# Patient Record
Sex: Female | Born: 1965 | Race: White | Hispanic: No | Marital: Married | Smoking: Never smoker
Health system: Southern US, Community
[De-identification: ages and names within clinical notes are randomized; demographics above are authoritative.]

## PROBLEM LIST (undated history)

## (undated) DIAGNOSIS — K219 Gastro-esophageal reflux disease without esophagitis: Secondary | ICD-10-CM

## (undated) DIAGNOSIS — M40209 Unspecified kyphosis, site unspecified: Secondary | ICD-10-CM

## (undated) DIAGNOSIS — T7840XA Allergy, unspecified, initial encounter: Secondary | ICD-10-CM

## (undated) HISTORY — PX: COLONOSCOPY: SHX174

## (undated) HISTORY — DX: Unspecified kyphosis, site unspecified: M40.209

## (undated) HISTORY — PX: WRIST SURGERY: SHX841

## (undated) HISTORY — PX: UPPER GASTROINTESTINAL ENDOSCOPY: SHX188

## (undated) HISTORY — DX: Allergy, unspecified, initial encounter: T78.40XA

## (undated) HISTORY — PX: HIP FRACTURE SURGERY: SHX118

## (undated) HISTORY — DX: Gastro-esophageal reflux disease without esophagitis: K21.9

---

## 1997-10-04 ENCOUNTER — Other Ambulatory Visit: Admission: RE | Admit: 1997-10-04 | Discharge: 1997-10-04 | Payer: Self-pay | Admitting: Obstetrics and Gynecology

## 1998-09-19 ENCOUNTER — Encounter: Payer: Self-pay | Admitting: Gastroenterology

## 1998-09-19 ENCOUNTER — Encounter (INDEPENDENT_AMBULATORY_CARE_PROVIDER_SITE_OTHER): Payer: Self-pay | Admitting: Specialist

## 1998-09-19 ENCOUNTER — Other Ambulatory Visit: Admission: RE | Admit: 1998-09-19 | Discharge: 1998-09-19 | Payer: Self-pay | Admitting: Gastroenterology

## 1998-10-07 ENCOUNTER — Other Ambulatory Visit: Admission: RE | Admit: 1998-10-07 | Discharge: 1998-10-07 | Payer: Self-pay | Admitting: Obstetrics and Gynecology

## 1999-11-12 ENCOUNTER — Other Ambulatory Visit: Admission: RE | Admit: 1999-11-12 | Discharge: 1999-11-12 | Payer: Self-pay | Admitting: *Deleted

## 1999-12-04 ENCOUNTER — Encounter: Admission: RE | Admit: 1999-12-04 | Discharge: 1999-12-04 | Payer: Self-pay | Admitting: Obstetrics and Gynecology

## 1999-12-04 ENCOUNTER — Encounter: Payer: Self-pay | Admitting: Obstetrics and Gynecology

## 2000-11-15 ENCOUNTER — Other Ambulatory Visit: Admission: RE | Admit: 2000-11-15 | Discharge: 2000-11-15 | Payer: Self-pay | Admitting: Obstetrics and Gynecology

## 2001-12-21 ENCOUNTER — Other Ambulatory Visit: Admission: RE | Admit: 2001-12-21 | Discharge: 2001-12-21 | Payer: Self-pay | Admitting: *Deleted

## 2002-12-27 ENCOUNTER — Other Ambulatory Visit: Admission: RE | Admit: 2002-12-27 | Discharge: 2002-12-27 | Payer: Self-pay

## 2003-01-03 ENCOUNTER — Encounter: Admission: RE | Admit: 2003-01-03 | Discharge: 2003-01-03 | Payer: Self-pay | Admitting: Obstetrics and Gynecology

## 2004-11-18 ENCOUNTER — Ambulatory Visit: Payer: Self-pay | Admitting: Gastroenterology

## 2005-12-02 ENCOUNTER — Ambulatory Visit: Payer: Self-pay | Admitting: Gastroenterology

## 2007-08-18 ENCOUNTER — Telehealth: Payer: Self-pay | Admitting: Gastroenterology

## 2007-10-05 ENCOUNTER — Ambulatory Visit: Payer: Self-pay | Admitting: Gastroenterology

## 2007-10-05 DIAGNOSIS — K219 Gastro-esophageal reflux disease without esophagitis: Secondary | ICD-10-CM

## 2007-10-26 ENCOUNTER — Telehealth: Payer: Self-pay | Admitting: Gastroenterology

## 2010-07-04 NOTE — Assessment & Plan Note (Signed)
[facility]                           GASTROENTEROLOGY OFFICE NOTE   BAY, JARQUIN                         MRN:          [id number]  DATE:12/02/2005                            DOB:          1965-04-06    This is a return office visit for GERD. Her reflux symptoms are well  controlled on AcipHex 40 mg p.o. q.a.m.  She has no dysphagia, odynophagia,  nocturnal symptoms, abdominal pain, change in bowel habits, melena,  hematochezia or weight loss.   CURRENT MEDICATIONS:  As listed on the chart; updated and reviewed.   ALLERGIES:  PENICILLIN AND SULFA DRUGS.   PHYSICAL EXAMINATION:  In no acute distress. Weight: 110 pounds. Blood  pressure: 108/70. Pulse: 80 and regular.  HEENT: Anicteric sclera. Oropharynx clear.  CHEST: Clear to auscultation bilaterally.  CARDIAC: Regular rate and rhythm without murmurs appreciated.  ABDOMEN: Soft and nontender. Nondistended. Normoactive bowel sounds. No  palpable organomegaly, masses or hernias.   ASSESSMENT/PLAN:  Gastroesophageal reflux disease.  Continue antireflux  measures and AcipHex 40 mg p.o. q.a.m. Return office visit one year.       Venita Lick. Russella Dar, MD, [facility]      MTS/MedQ  DD:  12/02/2005  DT:  12/03/2005  Job #:  [id number]

## 2011-04-07 ENCOUNTER — Ambulatory Visit: Payer: Self-pay | Admitting: Physician Assistant

## 2011-04-07 VITALS — BP 139/90 | HR 85 | Temp 98.1°F | Resp 16 | Ht 62.25 in | Wt 109.8 lb

## 2011-04-07 DIAGNOSIS — J019 Acute sinusitis, unspecified: Secondary | ICD-10-CM

## 2011-04-07 MED ORDER — IPRATROPIUM BROMIDE 0.03 % NA SOLN: 2.0000 | Freq: Two times a day (BID) | NASAL | Status: DC

## 2011-04-07 MED ORDER — CEFDINIR 300 MG PO CAPS: 600.0000 mg | ORAL_CAPSULE | Freq: Every day | ORAL | Status: AC

## 2011-04-07 NOTE — Progress Notes (Signed)
  Subjective:    Patient ID: SENG FOUTS, female    DOB: Mar 11, 1965, 46 y.o.   MRN: [id number]  Sore Throat  Associated symptoms include congestion, coughing and swollen glands. Pertinent negatives include no abdominal pain, diarrhea, ear pain, headaches, plugged ear sensation, neck pain or vomiting.  URI  The current episode started 1 to 4 weeks ago. Associated symptoms include congestion, coughing, rhinorrhea, a sore throat, swollen glands and wheezing. Pertinent negatives include no abdominal pain, chest pain, diarrhea, dysuria, ear pain, headaches, joint pain, joint swelling, nausea, neck pain, plugged ear sensation, rash, sinus pain, sneezing or vomiting. Treatments tried: took cephalexin 500 mg TID x 1 week. The treatment provided mild (temporary improvement) relief.   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 She completed a course of cephalexin for a dental infection around Christmas.  She is scheduled to have a root canal, but had to re-schedule due to this new illness.  She took a second course of cephalexin for her current illness with some improvement, but without complete relief.   Review of Systems  HENT: Positive for congestion, sore throat and rhinorrhea. Negative for ear pain, sneezing and neck pain.   Respiratory: Positive for cough and wheezing.   Cardiovascular: Negative for chest pain.  Gastrointestinal: Negative for nausea, vomiting, abdominal pain and diarrhea.  Genitourinary: Negative for dysuria.  Musculoskeletal: Negative for joint pain.  Skin: Negative for rash.  Neurological: Negative for headaches.       Objective:   Physical Exam  Vitals reviewed. Constitutional: She is oriented to person, place, and time. Vital signs are normal. She appears well-developed and well-nourished. No distress.  HENT:  Head: Normocephalic and atraumatic.  Right Ear: Hearing, tympanic membrane, external ear and ear canal normal.  Left Ear: Hearing, tympanic membrane, external  ear and ear canal normal.  Nose: Mucosal edema and rhinorrhea present.  No foreign bodies. Right sinus exhibits no maxillary sinus tenderness and no frontal sinus tenderness. Left sinus exhibits no maxillary sinus tenderness and no frontal sinus tenderness.  Mouth/Throat: Uvula is midline, oropharynx is clear and moist and mucous membranes are normal. No uvula swelling. No oropharyngeal exudate.  Eyes: Conjunctivae and EOM are normal. Pupils are equal, round, and reactive to light. Right eye exhibits no discharge. Left eye exhibits no discharge. No scleral icterus.  Neck: Trachea normal, normal range of motion and full passive range of motion without pain. Neck supple. No mass and no thyromegaly present.  Cardiovascular: Normal rate, regular rhythm and normal heart sounds.   Pulmonary/Chest: Effort normal and breath sounds normal.  Lymphadenopathy:       Head (right side): No submandibular, no tonsillar, no preauricular, no posterior auricular and no occipital adenopathy present.       Head (left side): No submandibular, no tonsillar, no preauricular and no occipital adenopathy present.    She has no cervical adenopathy.       Right: No supraclavicular adenopathy present.       Left: No supraclavicular adenopathy present.  Neurological: She is alert and oriented to person, place, and time. She has normal strength. No cranial nerve deficit or sensory deficit.  Skin: Skin is warm, dry and intact. No rash noted.  Psychiatric: She has a normal mood and affect. Her speech is normal and behavior is normal.       Assessment & Plan:  Sinusitis.  Cefdinir 300 mg, 2 PO BID x 10 days.  Atrovent NS BID.  Mucinex DM.  Rest, fluids, NSAIDS.

## 2011-04-07 NOTE — Patient Instructions (Signed)
Drink lots of water and get lots of rest.  Use atrovent nasal spray and mucinex.

## 2014-06-22 ENCOUNTER — Encounter: Payer: Self-pay | Admitting: Gastroenterology

## 2015-04-10 ENCOUNTER — Ambulatory Visit (INDEPENDENT_AMBULATORY_CARE_PROVIDER_SITE_OTHER): Payer: Self-pay

## 2015-04-10 ENCOUNTER — Ambulatory Visit (INDEPENDENT_AMBULATORY_CARE_PROVIDER_SITE_OTHER): Payer: Self-pay | Admitting: Emergency Medicine

## 2015-04-10 VITALS — BP 110/70 | HR 96 | Temp 100.3°F | Resp 17 | Ht 62.0 in | Wt 116.0 lb

## 2015-04-10 DIAGNOSIS — R059 Cough, unspecified: Secondary | ICD-10-CM

## 2015-04-10 DIAGNOSIS — R509 Fever, unspecified: Secondary | ICD-10-CM

## 2015-04-10 DIAGNOSIS — R05 Cough: Secondary | ICD-10-CM

## 2015-04-10 DIAGNOSIS — J189 Pneumonia, unspecified organism: Secondary | ICD-10-CM

## 2015-04-10 LAB — CBC WITH DIFFERENTIAL/PLATELET
Basophils Absolute: 0 10*3/uL (ref 0.0–0.1)
Basophils Relative: 0 % (ref 0–1)
Eosinophils Absolute: 0 10*3/uL (ref 0.0–0.7)
Eosinophils Relative: 1 % (ref 0–5)
HEMATOCRIT: 42.2 % (ref 36.0–46.0)
HEMOGLOBIN: 14 g/dL (ref 12.0–15.0)
LYMPHS PCT: 21 % (ref 12–46)
Lymphs Abs: 1 10*3/uL (ref 0.7–4.0)
MCH: 30.2 pg (ref 26.0–34.0)
MCHC: 33.2 g/dL (ref 30.0–36.0)
MCV: 91.1 fL (ref 78.0–100.0)
MONO ABS: 0.3 10*3/uL (ref 0.1–1.0)
MPV: 9.6 fL (ref 8.6–12.4)
Monocytes Relative: 7 % (ref 3–12)
NEUTROS ABS: 3.3 10*3/uL (ref 1.7–7.7)
Neutrophils Relative %: 71 % (ref 43–77)
Platelets: 193 10*3/uL (ref 150–400)
RBC: 4.63 MIL/uL (ref 3.87–5.11)
RDW: 13.5 % (ref 11.5–15.5)
WBC: 4.6 10*3/uL (ref 4.0–10.5)

## 2015-04-10 LAB — POCT INFLUENZA A/B
INFLUENZA B, POC: NEGATIVE
Influenza A, POC: NEGATIVE

## 2015-04-10 MED ORDER — LEVOFLOXACIN 500 MG PO TABS
500.0000 mg | ORAL_TABLET | Freq: Every day | ORAL | Status: AC
Start: 2015-04-10 — End: 2015-04-20

## 2015-04-10 MED ORDER — HYDROCODONE-HOMATROPINE 5-1.5 MG/5ML PO SYRP: 5.0000 mL | ORAL_SOLUTION | Freq: Three times a day (TID) | ORAL | Status: DC | PRN

## 2015-04-10 NOTE — Progress Notes (Signed)
By signing my name below, I, Moises Blood, attest that this documentation has been prepared under the direction and in the presence of Arlyss Queen, MD. Electronically Signed: Moises Blood, [place]. 04/10/2015 , 11:46 AM .  Patient was seen in room 2 .  Chief Complaint:  Chief Complaint  Patient presents with  . URI  . Nasal Congestion  . Cough    HPI: Morgan Merritt is a 50 y.o. female who reports to [facility] today complaining of cough and nasal congestion that started 5 days ago. Pt started coughing and feeling ill. She had some leftover zpak and started taking that 4 days ago. This usually works in the past but hasn't had any relief this time. She also notes having rhinorrhea and would cough up yellow phlegm. She's also tried mucinex last night. She worked a 12 hour shift yesterday. She was feeling warm last night. She denies receiving flu shot this year. She denies smoking.   She works as a Software engineer at [employer].   No past medical history on file. No past surgical history on file. Social History   Social History  . Marital Status: Married    Spouse Name: N/A  . Number of Children: N/A  . Years of Education: N/A   Social History Main Topics  . Smoking status: Never Smoker   . Smokeless tobacco: Not on file  . Alcohol Use: Not on file  . Drug Use: Not on file  . Sexual Activity: Not on file   Other Topics Concern  . Not on file   Social History Narrative  . No narrative on file   No family history on file. Allergies  Allergen Reactions  . Sulfonamide Derivatives Hives  . Penicillins Rash   Prior to Admission medications   Medication Sig Start Date End Date Taking? Authorizing Provider  Cranberry (AZO-CRANBERRY) 450 MG TABS Take by mouth.   Yes Historical Provider, MD  lansoprazole (PREVACID) 15 MG capsule Take 15 mg by mouth daily.   Yes Historical Provider, MD  Tragacanth (ASTRAGALUS ROOT) POWD by Does not apply route.   Yes Historical Provider, MD      ROS:  Constitutional: negative for night sweats, weight changes; positive for fever, chills, fatigue,  HEENT: negative for vision changes, hearing loss, ST, epistaxis, or sinus pressure; positive for congestion, rhinorrhea Cardiovascular: negative for chest pain or palpitations Respiratory: negative for hemoptysis, wheezing, shortness of breath; positive for cough Abdominal: negative for abdominal pain, nausea, vomiting, diarrhea, or constipation Dermatological: negative for rash Neurologic: negative for headache, dizziness, or syncope All other systems reviewed and are otherwise negative with the exception to those above and in the HPI.  PHYSICAL EXAM: Filed Vitals:   04/10/15 1055  BP: 110/70  Pulse: 96  Temp: 100.3 F (37.9 C)  Resp: 17   Body mass index is 21.21 kg/(m^2).   General: Alert, no acute distress; ill appearing with a croup like cough, not toxic HEENT:  Normocephalic, atraumatic, oropharynx patent. Eye: Juliette M[facility] Cardiovascular:  Regular rate and rhythm, no rubs murmurs or gallops.  No Carotid bruits, radial pulse intact. No pedal edema.  Respiratory: Bilateral rhonchi, No wheezes, or rales. No cyanosis, no use of accessory musculature Abdominal: No organomegaly, abdomen is soft and non-tender, positive bowel sounds. No masses. Musculoskeletal: Gait intact. No edema, tenderness Skin: No rashes. Neurologic: Facial musculature symmetric. Psychiatric: Patient acts appropriately throughout our interaction.  Lymphatic: No cervical or submandibular lymphadenopathy Genitourinary/Anorectal: No acute findings  LABS: Results for  orders placed or performed in visit on 04/10/15  POCT Influenza A/B  Result Value Ref Range   Influenza A, POC Negative Negative   Influenza B, POC Negative Negative   EKG/XRAY:   Primary read interpreted by Dr. Everlene Farrier at [facility]. Dg Chest 2 View  04/10/2015  CLINICAL DATA:  Shortness of breath. EXAM: CHEST  2 VIEW COMPARISON:  No  recent. FINDINGS: Mediastinum and hilar structures normal. Mild left perihilar infiltrate cannot be excluded. No pleural effusion pneumothorax. Heart size normal. Thoracic spine scoliosis . IMPRESSION: Mild left perihilar infiltrate cannot be excluded. Electronically Signed   By: Marcello Moores  Register   On: 04/10/2015 11:59    ASSESSMENT/PLAN:  Patient has a left perihilar infiltrate. Will treat as community-acquired pneumonia with Levaquin and Hycodan at night. Recheck in 48-72 hours if not improving.I personally performed the services described in this documentation, which was scribed in my presence. The recorded information has been reviewed and is accurate.  Gross sideeffects, risk and benefits, and alternatives of medications d/w patient. Patient is aware that all medications have potential sideeffects and we are unable to predict every sideeffect or drug-drug interaction that may occur.  Arlyss Queen MD 04/10/2015 11:46 AM

## 2015-04-10 NOTE — Patient Instructions (Addendum)
Because you received an x-ray today, you will receive an invoice from [facility]. Please contact [facility] at [phone] with questions or concerns regarding your invoice. Our billing staff will not be able to assist you with those questions.    Community-Acquired Pneumonia, Adult Pneumonia is an infection of the lungs. There are different types of pneumonia. One type can develop while a person is in a hospital. A different type, called community-acquired pneumonia, develops in people who are not, or have not recently been, in the hospital or other health care facility.  CAUSES Pneumonia may be caused by bacteria, viruses, or funguses. Community-acquired pneumonia is often caused by Streptococcus pneumonia bacteria. These bacteria are often passed from one person to another by breathing in droplets from the cough or sneeze of an infected person. RISK FACTORS The condition is more likely to develop in:  People who havechronic diseases, such as chronic obstructive pulmonary disease (COPD), asthma, congestive heart failure, cystic fibrosis, diabetes, or kidney disease.  People who haveearly-stage or late-stage HIV.  People who havesickle cell disease.  People who havehad their spleen removed (splenectomy).  People who havepoor dental hygiene.  People who havemedical conditions that increase the risk of breathing in (aspirating) secretions their own mouth and nose.   People who havea weakened immune system (immunocompromised).  People who smoke.  People whotravel to areas where pneumonia-causing germs commonly exist.  People whoare around animal habitats or animals that have pneumonia-causing germs, including birds, bats, rabbits, cats, and farm animals. SYMPTOMS Symptoms of this condition include:  Adry cough.  A wet (productive) cough.  Fever.  Sweating.  Chest pain, especially when breathing deeply or coughing.  Rapid breathing or difficulty  breathing.  Shortness of breath.  Shaking chills.  Fatigue.  Muscle aches. DIAGNOSIS Your health care provider will take a medical history and perform a physical exam. You may also have other tests, including:  Imaging studies of your chest, including X-rays.  Tests to check your blood oxygen level and other blood gases.  Other tests on blood, mucus (sputum), fluid around your lungs (pleural fluid), and urine. If your pneumonia is severe, other tests may be done to identify the specific cause of your illness. TREATMENT The type of treatment that you receive depends on many factors, such as the cause of your pneumonia, the medicines you take, and other medical conditions that you have. For most adults, treatment and recovery from pneumonia may occur at home. In some cases, treatment must happen in a hospital. Treatment may include:  Antibiotic medicines, if the pneumonia was caused by bacteria.  Antiviral medicines, if the pneumonia was caused by a virus.  Medicines that are given by mouth or through an IV tube.  Oxygen.  Respiratory therapy. Although rare, treating severe pneumonia may include:  Mechanical ventilation. This is done if you are not breathing well on your own and you cannot maintain a safe blood oxygen level.  Thoracentesis. This procedureremoves fluid around one lung or both lungs to help you breathe better. HOME CARE INSTRUCTIONS  Take over-the-counter and prescription medicines only as told by your health care provider.  Only takecough medicine if you are losing sleep. Understand that cough medicine can prevent your body's natural ability to remove mucus from your lungs.  If you were prescribed an antibiotic medicine, take it as told by your health care provider. Do not stop taking the antibiotic even if you start to feel better.  Sleep in a semi-upright position at night. Try   sleeping in a reclining chair, or place a few pillows under your head.  Do  not use tobacco products, including cigarettes, chewing tobacco, and e-cigarettes. If you need help quitting, ask your health care provider.  Drink enough water to keep your urine clear or pale yellow. This will help to thin out mucus secretions in your lungs. PREVENTION There are ways that you can decrease your risk of developing community-acquired pneumonia. Consider getting a pneumococcal vaccine if:  You are older than 51 years of age.  You are older than 51 years of age and are undergoing cancer treatment, have chronic lung disease, or have other medical conditions that affect your immune system. Ask your health care provider if this applies to you. There are different types and schedules of pneumococcal vaccines. Ask your health care provider which vaccination option is best for you. You may also prevent community-acquired pneumonia if you take these actions:  Get an influenza vaccine every year. Ask your health care provider which type of influenza vaccine is best for you.  Go to the dentist on a regular basis.  Wash your hands often. Use hand sanitizer if soap and water are not available. SEEK MEDICAL CARE IF:  You have a fever.  You are losing sleep because you cannot control your cough with cough medicine. SEEK IMMEDIATE MEDICAL CARE IF:  You have worsening shortness of breath.  You have increased chest pain.  Your sickness becomes worse, especially if you are an older adult or have a weakened immune system.  You cough up blood.   This information is not intended to replace advice given to you by your health care provider. Make sure you discuss any questions you have with your health care provider.   Document Released: 02/02/2005 Document Revised: 10/24/2014 Document Reviewed: 05/30/2014 Elsevier Interactive Patient Education 2016 Elsevier Inc.   

## 2017-04-30 ENCOUNTER — Encounter: Payer: Self-pay | Admitting: Urgent Care

## 2017-04-30 ENCOUNTER — Ambulatory Visit: Payer: Self-pay | Admitting: Urgent Care

## 2017-04-30 ENCOUNTER — Ambulatory Visit (INDEPENDENT_AMBULATORY_CARE_PROVIDER_SITE_OTHER): Payer: Self-pay

## 2017-04-30 ENCOUNTER — Other Ambulatory Visit: Payer: Self-pay | Admitting: Physician Assistant

## 2017-04-30 VITALS — BP 138/82 | HR 111 | Temp 98.7°F | Resp 16 | Ht 62.0 in | Wt 110.2 lb

## 2017-04-30 DIAGNOSIS — R05 Cough: Secondary | ICD-10-CM

## 2017-04-30 DIAGNOSIS — R058 Other specified cough: Secondary | ICD-10-CM

## 2017-04-30 DIAGNOSIS — R0789 Other chest pain: Secondary | ICD-10-CM

## 2017-04-30 DIAGNOSIS — R0989 Other specified symptoms and signs involving the circulatory and respiratory systems: Secondary | ICD-10-CM

## 2017-04-30 DIAGNOSIS — R5383 Other fatigue: Secondary | ICD-10-CM

## 2017-04-30 MED ORDER — BENZONATATE 100 MG PO CAPS: 100.0000 mg | ORAL_CAPSULE | Freq: Three times a day (TID) | ORAL | 0 refills | Status: DC | PRN

## 2017-04-30 MED ORDER — PREDNISONE 20 MG PO TABS: ORAL_TABLET | ORAL | 0 refills | Status: DC

## 2017-04-30 MED ORDER — ALBUTEROL SULFATE HFA 108 (90 BASE) MCG/ACT IN AERS: 2.0000 | INHALATION_SPRAY | Freq: Four times a day (QID) | RESPIRATORY_TRACT | 1 refills | Status: DC | PRN

## 2017-04-30 NOTE — Patient Instructions (Addendum)
Cough, Adult Coughing is a reflex that clears your throat and your airways. Coughing helps to heal and protect your lungs. It is normal to cough occasionally, but a cough that happens with other symptoms or lasts a long time may be a sign of a condition that needs treatment. A cough may last only 2-3 weeks (acute), or it may last longer than 8 weeks (chronic). What are the causes? Coughing is commonly caused by:  Breathing in substances that irritate your lungs.  A viral or bacterial respiratory infection.  Allergies.  Asthma.  Postnasal drip.  Smoking.  Acid backing up from the stomach into the esophagus (gastroesophageal reflux).  Certain medicines.  Chronic lung problems, including COPD (or rarely, lung cancer).  Other medical conditions such as heart failure.  Follow these instructions at home: Pay attention to any changes in your symptoms. Take these actions to help with your discomfort:  Take medicines only as told by your health care provider. ? If you were prescribed an antibiotic medicine, take it as told by your health care provider. Do not stop taking the antibiotic even if you start to feel better. ? Talk with your health care provider before you take a cough suppressant medicine.  Drink enough fluid to keep your urine clear or pale yellow.  If the air is dry, use a cold steam vaporizer or humidifier in your bedroom or your home to help loosen secretions.  Avoid anything that causes you to cough at work or at home.  If your cough is worse at night, try sleeping in a semi-upright position.  Avoid cigarette smoke. If you smoke, quit smoking. If you need help quitting, ask your health care provider.  Avoid caffeine.  Avoid alcohol.  Rest as needed.  Contact a health care provider if:  You have new symptoms.  You cough up pus.  Your cough does not get better after 2-3 weeks, or your cough gets worse.  You cannot control your cough with suppressant  medicines and you are losing sleep.  You develop pain that is getting worse or pain that is not controlled with pain medicines.  You have a fever.  You have unexplained weight loss.  You have night sweats. Get help right away if:  You cough up blood.  You have difficulty breathing.  Your heartbeat is very fast. This information is not intended to replace advice given to you by your health care provider. Make sure you discuss any questions you have with your health care provider. Document Released: 08/01/2010 Document Revised: 07/11/2015 Document Reviewed: 04/11/2014 Elsevier Interactive Patient Education  2018 [employer].     IF you received an x-ray today, you will receive an invoice from [facility]. Please contact [facility] at [phone] with questions or concerns regarding your invoice.   IF you received labwork today, you will receive an invoice from [facility]. Please contact [facility] at [phone] with questions or concerns regarding your invoice.   Our billing staff will not be able to assist you with questions regarding bills from these companies.  You will be contacted with the lab results as soon as they are available. The fastest way to get your results is to activate your My Chart account. Instructions are located on the last page of this paperwork. If you have not heard from [place] regarding the results in 2 weeks, please contact this office.

## 2017-04-30 NOTE — Progress Notes (Signed)
   MRN: [id number] DOB: 14-May-1965  Subjective:   Morgan Merritt is a 52 y.o. female presenting for several month history of productive cough, fatigue, sore throat, subjective fever. Denies chest pain, shob, wheezing, n/v, abdominal pain, sinus pain, ear pain. Denies smoking cigarettes. Has wine or beer but does not drink excessively. Denies history of asthma.   Morgan Merritt has a current medication list which includes the following prescription(s): cranberry, lansoprazole, and astragalus root. Also is allergic to sulfonamide derivatives and penicillins.  Morgan Merritt  has a past medical history of Kyphosis. Also  has a past surgical history that includes Hip fracture surgery.  Objective:   Vitals: BP 138/82   Pulse (!) 111   Temp 98.7 F (37.1 C) (Oral)   Resp 16   Ht 5\' 2"  (1.575 m)   Wt 110 lb 3.2 oz (50 kg)   SpO2 99%   BMI 20.16 kg/m   Physical Exam  Constitutional: She is oriented to person, place, and time. She appears well-developed and well-nourished.  HENT:  Right Ear: Tympanic membrane normal.  Left Ear: Tympanic membrane normal.  Nose: No sinus tenderness.  Mouth/Throat: Oropharynx is clear and moist.  Eyes: Right eye exhibits no discharge. Left eye exhibits no discharge. No scleral icterus.  Neck: Normal range of motion. Neck supple.  Cardiovascular: Normal rate, regular rhythm and intact distal pulses. Exam reveals no gallop and no friction rub.  No murmur heard. Pulmonary/Chest: No respiratory distress. She has no wheezes. She has no rales.  Lymphadenopathy:    She has no cervical adenopathy.  Neurological: She is alert and oriented to person, place, and time.  Skin: Skin is warm and dry.  Psychiatric: She has a normal mood and affect.   Dg Chest 2 View  Result Date: 04/30/2017 CLINICAL DATA:  52 year old female with productive cough for 3 months. Chest congestion and fatigue. EXAM: CHEST - 2 VIEW COMPARISON:  Chest radiographs 04/10/2015. FINDINGS: Stable lung volumes,  hyperinflation with chronic increased AP dimension to the chest. Mediastinal contours remain normal. Visualized tracheal air column is within normal limits. No pneumothorax, pulmonary edema, pleural effusion or confluent pulmonary opacity. Osteopenia and chronic exaggerated thoracic kyphosis. No acute osseous abnormality identified. Negative visible bowel gas pattern. IMPRESSION: Chronic pulmonary hyperinflation. No acute cardiopulmonary abnormality. Electronically Signed   By: Genevie Ann M.D.   On: 04/30/2017 15:02    Assessment and Plan :   Productive cough - Plan: DG Chest 2 View  Chest congestion - Plan: DG Chest 2 View  Other fatigue - Plan: DG Chest 2 View  Atypical chest pain - Plan: DG Chest 2 View  Will start short steroid course. Offered albuterol inhaler for bronchospasms, start Tessalon capsules. Counseled patient on potential for adverse effects with medications prescribed today, patient verbalized understanding.  Jaynee Eagles, PA-C [facility] [phone] 04/30/2017  2:42 PM

## 2017-05-05 ENCOUNTER — Other Ambulatory Visit: Payer: Self-pay

## 2017-05-05 ENCOUNTER — Ambulatory Visit: Payer: Self-pay | Admitting: Physician Assistant

## 2017-05-05 ENCOUNTER — Encounter: Payer: Self-pay | Admitting: Physician Assistant

## 2017-05-05 ENCOUNTER — Telehealth: Payer: Self-pay | Admitting: Urgent Care

## 2017-05-05 VITALS — BP 155/93 | HR 98 | Temp 99.6°F | Resp 16 | Ht 62.0 in | Wt 110.6 lb

## 2017-05-05 DIAGNOSIS — R05 Cough: Secondary | ICD-10-CM

## 2017-05-05 DIAGNOSIS — R058 Other specified cough: Secondary | ICD-10-CM

## 2017-05-05 DIAGNOSIS — J40 Bronchitis, not specified as acute or chronic: Secondary | ICD-10-CM

## 2017-05-05 MED ORDER — DOXYCYCLINE HYCLATE 100 MG PO CAPS: 100.0000 mg | ORAL_CAPSULE | Freq: Two times a day (BID) | ORAL | 0 refills | Status: DC

## 2017-05-05 MED ORDER — HYDROCODONE-HOMATROPINE 5-1.5 MG/5ML PO SYRP: 5.0000 mL | ORAL_SOLUTION | Freq: Three times a day (TID) | ORAL | 0 refills | Status: DC | PRN

## 2017-05-05 NOTE — Patient Instructions (Addendum)
- We will treat this for antibiotic at this time.  - I recommend you rest, drink plenty of fluids, eat light meals including soups.  - You may use cough syrup at night for your cough and sore throat, Tessalon pearls during the day. Be aware that cough syrup can definitely make you drowsy and sleepy so do not drive or operate any heavy machinery if it is affecting you during the day.  - You may also use Tylenol or ibuprofen over-the-counter for your sore throat.  - You may use over the counter flonase for stuffy nose.  - Please let me know if you are not seeing any improvement in 2 weeks or your symptoms worsen. .   Cough, Adult A cough helps to clear your throat and lungs. A cough may last only 2-3 weeks (acute), or it may last longer than 8 weeks (chronic). Many different things can cause a cough. A cough may be a sign of an illness or another medical condition. Follow these instructions at home:  Pay attention to any changes in your cough.  Take medicines only as told by your doctor. ? If you were prescribed an antibiotic medicine, take it as told by your doctor. Do not stop taking it even if you start to feel better. ? Talk with your doctor before you try using a cough medicine.  Drink enough fluid to keep your pee (urine) clear or pale yellow.  If the air is dry, use a cold steam vaporizer or humidifier in your home.  Stay away from things that make you cough at work or at home.  If your cough is worse at night, try using extra pillows to raise your head up higher while you sleep.  Do not smoke, and try not to be around smoke. If you need help quitting, ask your doctor.  Do not have caffeine.  Do not drink alcohol.  Rest as needed. Contact a doctor if:  You have new problems (symptoms).  You cough up yellow fluid (pus).  Your cough does not get better after 2-3 weeks, or your cough gets worse.  Medicine does not help your cough and you are not sleeping well.  You have  pain that gets worse or pain that is not helped with medicine.  You have a fever.  You are losing weight and you do not know why.  You have night sweats. Get help right away if:  You cough up blood.  You have trouble breathing.  Your heartbeat is very fast. This information is not intended to replace advice given to you by your health care provider. Make sure you discuss any questions you have with your health care provider. Document Released: 10/16/2010 Document Revised: 07/11/2015 Document Reviewed: 04/11/2014 Elsevier Interactive Patient Education  2018 [employer].    IF you received an x-ray today, you will receive an invoice from [facility]. Please contact [facility] at [phone] with questions or concerns regarding your invoice.   IF you received labwork today, you will receive an invoice from [facility]. Please contact [facility] at [phone] with questions or concerns regarding your invoice.   Our billing staff will not be able to assist you with questions regarding bills from these companies.  You will be contacted with the lab results as soon as they are available. The fastest way to get your results is to activate your My Chart account. Instructions are located on the last page of this paperwork. If you have not heard from [place]  regarding the results in 2 weeks, please contact this office.

## 2017-05-05 NOTE — Telephone Encounter (Signed)
Copied from [facility]. Topic: Quick Communication - See Telephone Encounter >> May 05, 2017  8:15 AM Aurelio Brash B wrote: CRM for notification. See Telephone encounter for:  Pt was in office Friday for a cough, she states it is not better but worse, and has taken all the prednisone and is now asking if she can get antibiotic  called in to:  [facility], [place], [place] - [place] [phone] (Phone) [phone] (Fax)   05/05/17.

## 2017-05-05 NOTE — Progress Notes (Signed)
MRN: [id number] DOB: 09-16-1965  Subjective:   Morgan Merritt is a 52 y.o. female presenting for chief complaint of cough/congestion, ? full blown bronchitis (saw Bess Harvest on 04/30/17 and he did chest x ray and it was normal. per pt she is getting worse, took albuterol, tessalon , prednisone with no relief, can't sleep due to cough and taking robitussin with no relief.) .  Reports several month history off and on productive cough, sore throat, and subjective fever.  Last seen on 04/30/17. Was tx with prednisone course. Notes it got more productive. Keeping her up at night, needs cough syrup to help with sleep. Started left over azithromycin for the past two days but does not think this works for her.  Has tried delsym and mucinex with no full relief. Denies sinus pain, ear pain, shortness of breath and chest pain, nausea, vomiting, abdominal pain and diarrhea. Has history of seasonal allergies, no history of asthma. Patient has not flu shot this season. Denies smoking. Denies recent travel. Denies any other aggravating or relieving factors, no other questions or concerns.    Morgan Merritt has a current medication list which includes the following prescription(s): cholecalciferol, cranberry, metronidazole, pyridoxine, astragalus root, albuterol, benzonatate, lansoprazole, and prednisone. Also is allergic to sulfonamide derivatives and penicillins.  Morgan Merritt  has a past medical history of Kyphosis. Also  has a past surgical history that includes Hip fracture surgery.   Objective:   Vitals: BP (!) 155/93 (BP Location: Right Arm, Patient Position: Sitting, Cuff Size: Normal)   Pulse 98   Temp 99.6 F (37.6 C) (Oral)   Resp 16   Ht 5\' 2"  (1.575 m)   Wt 110 lb 9.6 oz (50.2 kg)   LMP 10/03/2016   SpO2 96%   BMI 20.23 kg/m   Physical Exam  Constitutional: She is oriented to person, place, and time. She appears well-developed and well-nourished.  HENT:  Head: Normocephalic and atraumatic.  Right Ear:  Tympanic membrane, external ear and ear canal normal.  Left Ear: Tympanic membrane, external ear and ear canal normal.  Nose: Mucosal edema present. Right sinus exhibits no maxillary sinus tenderness and no frontal sinus tenderness. Left sinus exhibits no maxillary sinus tenderness and no frontal sinus tenderness.  Mouth/Throat: Uvula is midline, oropharynx is clear and moist and mucous membranes are normal. No tonsillar exudate.  Eyes: Conjunctivae are normal.  Neck: Normal range of motion.  Pulmonary/Chest: Effort normal and breath sounds normal. She has no decreased breath sounds. She has no wheezes. She has no rhonchi. She has no rales.  Lymphadenopathy:       Head (right side): No submental, no submandibular, no tonsillar, no preauricular, no posterior auricular and no occipital adenopathy present.       Head (left side): No submental, no submandibular, no tonsillar, no preauricular, no posterior auricular and no occipital adenopathy present.    She has no cervical adenopathy.       Right: No supraclavicular adenopathy present.       Left: No supraclavicular adenopathy present.  Neurological: She is alert and oriented to person, place, and time.  Skin: Skin is warm and dry.  Psychiatric: She has a normal mood and affect.  Vitals reviewed.    No results found for this or any previous visit (from the past 24 hour(s)).  Assessment and Plan :  1. Productive cough - HYDROcodone-homatropine (HYCODAN) 5-1.5 MG/5ML syrup; Take 5 mLs by mouth every 8 (eight) hours as needed for cough.  Dispense:  75 mL; Refill: 0 2. Bronchitis Lungs CTAB. Vitals stable. Due to duration and worsening of cough, will treat empirically for underlying bacterial etiology at this time.  Will also provide symptomatic relief with Hycodan cough syrup.  Advised to return to clinic if symptoms worsen, do not improve, or as needed. Recommend repeat CXR if pt returns.  - doxycycline (VIBRAMYCIN) 100 MG capsule; Take 1 capsule  (100 mg total) by mouth 2 (two) times daily.  Dispense: 20 capsule; Refill: 0  Tenna Delaine, PA-C  [facility] 05/05/2017 11:57 AM

## 2017-05-06 NOTE — Telephone Encounter (Signed)
Patient seen by Tenna Delaine later that day, prescribed doxycycline.

## 2017-05-24 ENCOUNTER — Encounter: Payer: Self-pay | Admitting: Physician Assistant

## 2017-07-21 DIAGNOSIS — M9903 Segmental and somatic dysfunction of lumbar region: Secondary | ICD-10-CM | POA: Diagnosis not present

## 2017-07-21 DIAGNOSIS — M4124 Other idiopathic scoliosis, thoracic region: Secondary | ICD-10-CM | POA: Diagnosis not present

## 2017-07-21 DIAGNOSIS — M9901 Segmental and somatic dysfunction of cervical region: Secondary | ICD-10-CM | POA: Diagnosis not present

## 2017-07-21 DIAGNOSIS — M62830 Muscle spasm of back: Secondary | ICD-10-CM | POA: Diagnosis not present

## 2017-08-25 DIAGNOSIS — M62830 Muscle spasm of back: Secondary | ICD-10-CM | POA: Diagnosis not present

## 2017-08-25 DIAGNOSIS — M9903 Segmental and somatic dysfunction of lumbar region: Secondary | ICD-10-CM | POA: Diagnosis not present

## 2017-08-25 DIAGNOSIS — M9901 Segmental and somatic dysfunction of cervical region: Secondary | ICD-10-CM | POA: Diagnosis not present

## 2017-08-25 DIAGNOSIS — M4124 Other idiopathic scoliosis, thoracic region: Secondary | ICD-10-CM | POA: Diagnosis not present

## 2017-09-23 DIAGNOSIS — M9903 Segmental and somatic dysfunction of lumbar region: Secondary | ICD-10-CM | POA: Diagnosis not present

## 2017-09-23 DIAGNOSIS — M9901 Segmental and somatic dysfunction of cervical region: Secondary | ICD-10-CM | POA: Diagnosis not present

## 2017-09-23 DIAGNOSIS — M4124 Other idiopathic scoliosis, thoracic region: Secondary | ICD-10-CM | POA: Diagnosis not present

## 2017-09-23 DIAGNOSIS — M62830 Muscle spasm of back: Secondary | ICD-10-CM | POA: Diagnosis not present

## 2017-10-20 DIAGNOSIS — M4124 Other idiopathic scoliosis, thoracic region: Secondary | ICD-10-CM | POA: Diagnosis not present

## 2017-10-20 DIAGNOSIS — M62830 Muscle spasm of back: Secondary | ICD-10-CM | POA: Diagnosis not present

## 2017-10-20 DIAGNOSIS — M9903 Segmental and somatic dysfunction of lumbar region: Secondary | ICD-10-CM | POA: Diagnosis not present

## 2017-10-20 DIAGNOSIS — M9901 Segmental and somatic dysfunction of cervical region: Secondary | ICD-10-CM | POA: Diagnosis not present

## 2017-11-25 DIAGNOSIS — M62830 Muscle spasm of back: Secondary | ICD-10-CM | POA: Diagnosis not present

## 2017-11-25 DIAGNOSIS — M9901 Segmental and somatic dysfunction of cervical region: Secondary | ICD-10-CM | POA: Diagnosis not present

## 2017-11-25 DIAGNOSIS — M4124 Other idiopathic scoliosis, thoracic region: Secondary | ICD-10-CM | POA: Diagnosis not present

## 2017-11-25 DIAGNOSIS — M9903 Segmental and somatic dysfunction of lumbar region: Secondary | ICD-10-CM | POA: Diagnosis not present

## 2017-11-30 DIAGNOSIS — L821 Other seborrheic keratosis: Secondary | ICD-10-CM | POA: Diagnosis not present

## 2017-11-30 DIAGNOSIS — L218 Other seborrheic dermatitis: Secondary | ICD-10-CM | POA: Diagnosis not present

## 2017-12-22 DIAGNOSIS — M9901 Segmental and somatic dysfunction of cervical region: Secondary | ICD-10-CM | POA: Diagnosis not present

## 2017-12-22 DIAGNOSIS — M62830 Muscle spasm of back: Secondary | ICD-10-CM | POA: Diagnosis not present

## 2017-12-22 DIAGNOSIS — M4124 Other idiopathic scoliosis, thoracic region: Secondary | ICD-10-CM | POA: Diagnosis not present

## 2017-12-22 DIAGNOSIS — M9903 Segmental and somatic dysfunction of lumbar region: Secondary | ICD-10-CM | POA: Diagnosis not present

## 2018-01-20 DIAGNOSIS — M62830 Muscle spasm of back: Secondary | ICD-10-CM | POA: Diagnosis not present

## 2018-01-20 DIAGNOSIS — M4124 Other idiopathic scoliosis, thoracic region: Secondary | ICD-10-CM | POA: Diagnosis not present

## 2018-01-20 DIAGNOSIS — M9903 Segmental and somatic dysfunction of lumbar region: Secondary | ICD-10-CM | POA: Diagnosis not present

## 2018-01-20 DIAGNOSIS — M9901 Segmental and somatic dysfunction of cervical region: Secondary | ICD-10-CM | POA: Diagnosis not present

## 2018-02-14 DIAGNOSIS — Z6820 Body mass index (BMI) 20.0-20.9, adult: Secondary | ICD-10-CM | POA: Diagnosis not present

## 2018-02-14 DIAGNOSIS — Z1231 Encounter for screening mammogram for malignant neoplasm of breast: Secondary | ICD-10-CM | POA: Diagnosis not present

## 2018-02-14 DIAGNOSIS — Z01419 Encounter for gynecological examination (general) (routine) without abnormal findings: Secondary | ICD-10-CM | POA: Diagnosis not present

## 2018-03-23 ENCOUNTER — Encounter: Payer: Self-pay | Admitting: Physician Assistant

## 2018-03-23 ENCOUNTER — Ambulatory Visit: Payer: BLUE CROSS/BLUE SHIELD | Admitting: Physician Assistant

## 2018-03-23 ENCOUNTER — Other Ambulatory Visit: Payer: Self-pay

## 2018-03-23 VITALS — BP 120/84 | HR 102 | Temp 99.9°F | Resp 20 | Ht 61.42 in | Wt 110.2 lb

## 2018-03-23 DIAGNOSIS — R05 Cough: Secondary | ICD-10-CM | POA: Diagnosis not present

## 2018-03-23 DIAGNOSIS — J209 Acute bronchitis, unspecified: Secondary | ICD-10-CM | POA: Diagnosis not present

## 2018-03-23 DIAGNOSIS — R52 Pain, unspecified: Secondary | ICD-10-CM

## 2018-03-23 DIAGNOSIS — R059 Cough, unspecified: Secondary | ICD-10-CM

## 2018-03-23 LAB — POCT INFLUENZA A/B
Influenza A, POC: NEGATIVE
Influenza B, POC: NEGATIVE

## 2018-03-23 MED ORDER — ALBUTEROL SULFATE HFA 108 (90 BASE) MCG/ACT IN AERS: 2.0000 | INHALATION_SPRAY | Freq: Four times a day (QID) | RESPIRATORY_TRACT | 1 refills | Status: DC | PRN

## 2018-03-23 MED ORDER — AZITHROMYCIN 250 MG PO TABS: ORAL_TABLET | ORAL | 0 refills | Status: DC

## 2018-03-23 MED ORDER — HYDROCOD POLST-CPM POLST ER 10-8 MG/5ML PO SUER: 5.0000 mL | Freq: Two times a day (BID) | ORAL | 0 refills | Status: DC | PRN

## 2018-03-23 NOTE — Patient Instructions (Addendum)
Azithromycin is an antibiotic. Take the entire course of this medication, even if you start to feel better sooner.  Tussionex is to help your cough at night. This will make you drowsy. Do not take this with any other medications that make your drowsy.   You can also con't taking Mucinex this will help thin out your mucus to help you clear it out. Drink plenty of water while taking this medication.   Stay well hydrated. Get lost of rest. Wash your hands often.   -Foods that can help speed recovery: honey, garlic, chicken soup, elderberries, green tea.  -Supplements that can help speed recovery: vitamin C, zinc, elderberry extract, quercetin, ginseng, selenium -Supplement with prebiotics and probiotics:   Advil or ibuprofen for pain. Do not take Aspirin.  Drink enough water and fluids to keep your urine clear or pale yellow.  For sore throat: ? Gargle with 8 oz of salt water ( tsp of salt per 1 qt of water) as often as every 1-2 hours to soothe your throat.  Gargle liquid benadryl.  Cepacol throat lozenges (if you are not at risk for choking).  For sore throat try using a honey-based tea. Use 3 teaspoons of honey with juice squeezed from half lemon. Place shaved pieces of ginger into 1/2-1 cup of water and warm over stove top. Then mix the ingredients and repeat every 4 hours as needed.  Cough Syrup Recipe: Sweet Lemon & Honey Thyme  Ingredients . a handful of fresh thyme sprigs   . 1 pint of water (2 cups)  . 1/2 cup honey (raw is best, but regular will do)  . 1/2 lemon chopped Instructions 1. Place the lemon in the pint jar and cover with the honey. The honey will macerate the lemons and draw out liquids which taste so delicious! 2. Meanwhile, toss the thyme leaves into a saucepan and cover them with the water. 3. Bring the water to a gentle simmer and reduce it to half, about a cup of tea. 4. When the tea is reduced and cooled a bit, strain the sprigs & leaves, add it into the  pint jar and stir it well. 5. Give it a shake and use a spoonful as needed. 6. Store your homemade cough syrup in the refrigerator for about a month.  What causes a cough?  In adults, common causes of a cough include: ?An infection of the airways or lungs (such as the common cold) ?Postnasal drip - Postnasal drip is when mucus from the nose drips down or flows along the back of the throat. Postnasal drip can happen when people have: .A cold .Allergies .A sinus infection - The sinuses are hollow areas in the bones of the face that open into the nose. ?Lung conditions, like asthma and chronic obstructive pulmonary disease (COPD) - Both of these conditions can make it hard to breathe. COPD is usually caused by smoking. ?Acid reflux - Acid reflux is when the acid that is normally in your stomach backs up into your esophagus (the tube that carries food from your mouth to your stomach). ?A side effect from blood pressure medicines called "ACE inhibitors" ?Smoking cigarettes  Is there anything I can do on my own to get rid of my cough?  Yes. To help get rid of your cough, you can: ?Use a humidifier in your bedroom ?Use an over-the-counter cough medicine, or suck on cough drops or hard candy ?Stop smoking, if you smoke ?If you have allergies, avoid  the things you are allergic to (like pollen, dust, animals, or mold) If you have acid reflux, your doctor or nurse will tell you which lifestyle changes can help reduce symptoms.

## 2018-03-23 NOTE — Progress Notes (Signed)
Morgan Merritt  MRN: [id number] DOB: 1965-10-05  PCP: Patient, No Pcp Per  Subjective:  Pt is a 53 year old female who presents to clinic for cough, body aches and chills.  Endorses a low grade fever of 99.9.  Symptoms started 5 days ago. Seem to be getting worse.  She is taking Mucinex and allergy medication.   Review of Systems  Constitutional: Positive for chills and fever. Negative for diaphoresis and fatigue.  HENT: Positive for congestion and rhinorrhea. Negative for postnasal drip, sinus pressure, sinus pain and sore throat.   Respiratory: Negative for cough, shortness of breath and wheezing.   Psychiatric/Behavioral: Negative for sleep disturbance.    Patient Active Problem List   Diagnosis Date Noted  . GERD 10/05/2007    Current Outpatient Medications on File Prior to Visit  Medication Sig Dispense Refill  . cholecalciferol (VITAMIN D) 400 units TABS tablet Take 400 Units by mouth.    . Cranberry (AZO-CRANBERRY) 450 MG TABS Take by mouth.    . lansoprazole (PREVACID) 15 MG capsule Take 15 mg by mouth daily.    . metroNIDAZOLE (METROGEL) 0.75 % gel Apply 1 application topically 2 (two) times daily.    Marland Kitchen pyridOXINE (VITAMIN B-6) 100 MG tablet Take 100 mg by mouth daily.    . Tragacanth (ASTRAGALUS ROOT) POWD by Does not apply route.    Marland Kitchen albuterol (PROVENTIL HFA;VENTOLIN HFA) 108 (90 Base) MCG/ACT inhaler Inhale 2 puffs into the lungs every 6 (six) hours as needed. (Patient not taking: Reported on 05/05/2017) 1 Inhaler 1  . benzonatate (TESSALON) 100 MG capsule Take 1-2 capsules (100-200 mg total) by mouth 3 (three) times daily as needed. (Patient not taking: Reported on 05/05/2017) 60 capsule 0  . doxycycline (VIBRAMYCIN) 100 MG capsule Take 1 capsule (100 mg total) by mouth 2 (two) times daily. (Patient not taking: Reported on 03/23/2018) 20 capsule 0  . HYDROcodone-homatropine (HYCODAN) 5-1.5 MG/5ML syrup Take 5 mLs by mouth every 8 (eight) hours as needed for cough. (Patient  not taking: Reported on 03/23/2018) 75 mL 0  . predniSONE (DELTASONE) 20 MG tablet Take 2 tablets daily with breakfast. (Patient not taking: Reported on 05/05/2017) 10 tablet 0   No current facility-administered medications on file prior to visit.     Allergies  Allergen Reactions  . Sulfonamide Derivatives Hives  . Penicillins Rash     Objective:  BP 120/84   Pulse (!) 102   Temp 99.9 F (37.7 C) (Oral)   Resp 20   Ht 5' 1.42" (1.56 m)   Wt 110 lb 3.2 oz (50 kg)   SpO2 96%   BMI 20.54 kg/m   Physical Exam Vitals signs reviewed.  Constitutional:      General: She is not in acute distress. HENT:     Right Ear: Tympanic membrane normal.     Left Ear: Tympanic membrane normal.     Nose: Mucosal edema present. No rhinorrhea.     Right Sinus: No maxillary sinus tenderness or frontal sinus tenderness.     Left Sinus: No maxillary sinus tenderness or frontal sinus tenderness.  Cardiovascular:     Rate and Rhythm: Normal rate and regular rhythm.     Heart sounds: Normal heart sounds.  Pulmonary:     Effort: Pulmonary effort is normal. No respiratory distress.     Breath sounds: Normal breath sounds. No wheezing or rales.  Skin:    General: Skin is warm and dry.  Neurological:  Mental Status: She is alert and oriented to person, place, and time.  Psychiatric:        Judgment: Judgment normal.    Results for orders placed or performed in visit on 03/23/18  POCT Influenza A/B  Result Value Ref Range   Influenza A, POC Negative Negative   Influenza B, POC Negative Negative    Assessment and Plan :  1. Acute bronchitis, unspecified organism - azithromycin (ZITHROMAX) 250 MG tablet; Take 2 tabs PO x 1 dose, then 1 tab PO QD x 4 days  Dispense: 6 tablet; Refill: 0  2. Generalized body aches - POCT Influenza A/B  3. Cough - albuterol (PROVENTIL HFA;VENTOLIN HFA) 108 (90 Base) MCG/ACT inhaler; Inhale 2 puffs into the lungs every 6 (six) hours as needed.  Dispense: 1  Inhaler; Refill: 1 - chlorpheniramine-HYDROcodone (TUSSIONEX PENNKINETIC ER) 10-8 MG/5ML SUER; Take 5 mLs by mouth every 12 (twelve) hours as needed for cough.  Dispense: 100 mL; Refill: 0    Whitney Gor Vestal, PA-C  [facility] 03/23/2018 9:43 AM  Please note: Portions of this report may have been transcribed using dragon voice recognition software. Every effort was made to ensure accuracy; however, inadvertent computerized transcription errors may be present.

## 2018-07-26 DIAGNOSIS — L218 Other seborrheic dermatitis: Secondary | ICD-10-CM | POA: Diagnosis not present

## 2019-02-21 IMAGING — DX DG CHEST 2V
2 series · 2 of 2 positions shown · non-contrast
Comparison: Chest radiographs 04/10/2015.

CLINICAL DATA: 51-year-old female with productive cough for 3
months. Chest congestion and fatigue.

EXAM:
CHEST - 2 VIEW

[chest pa]
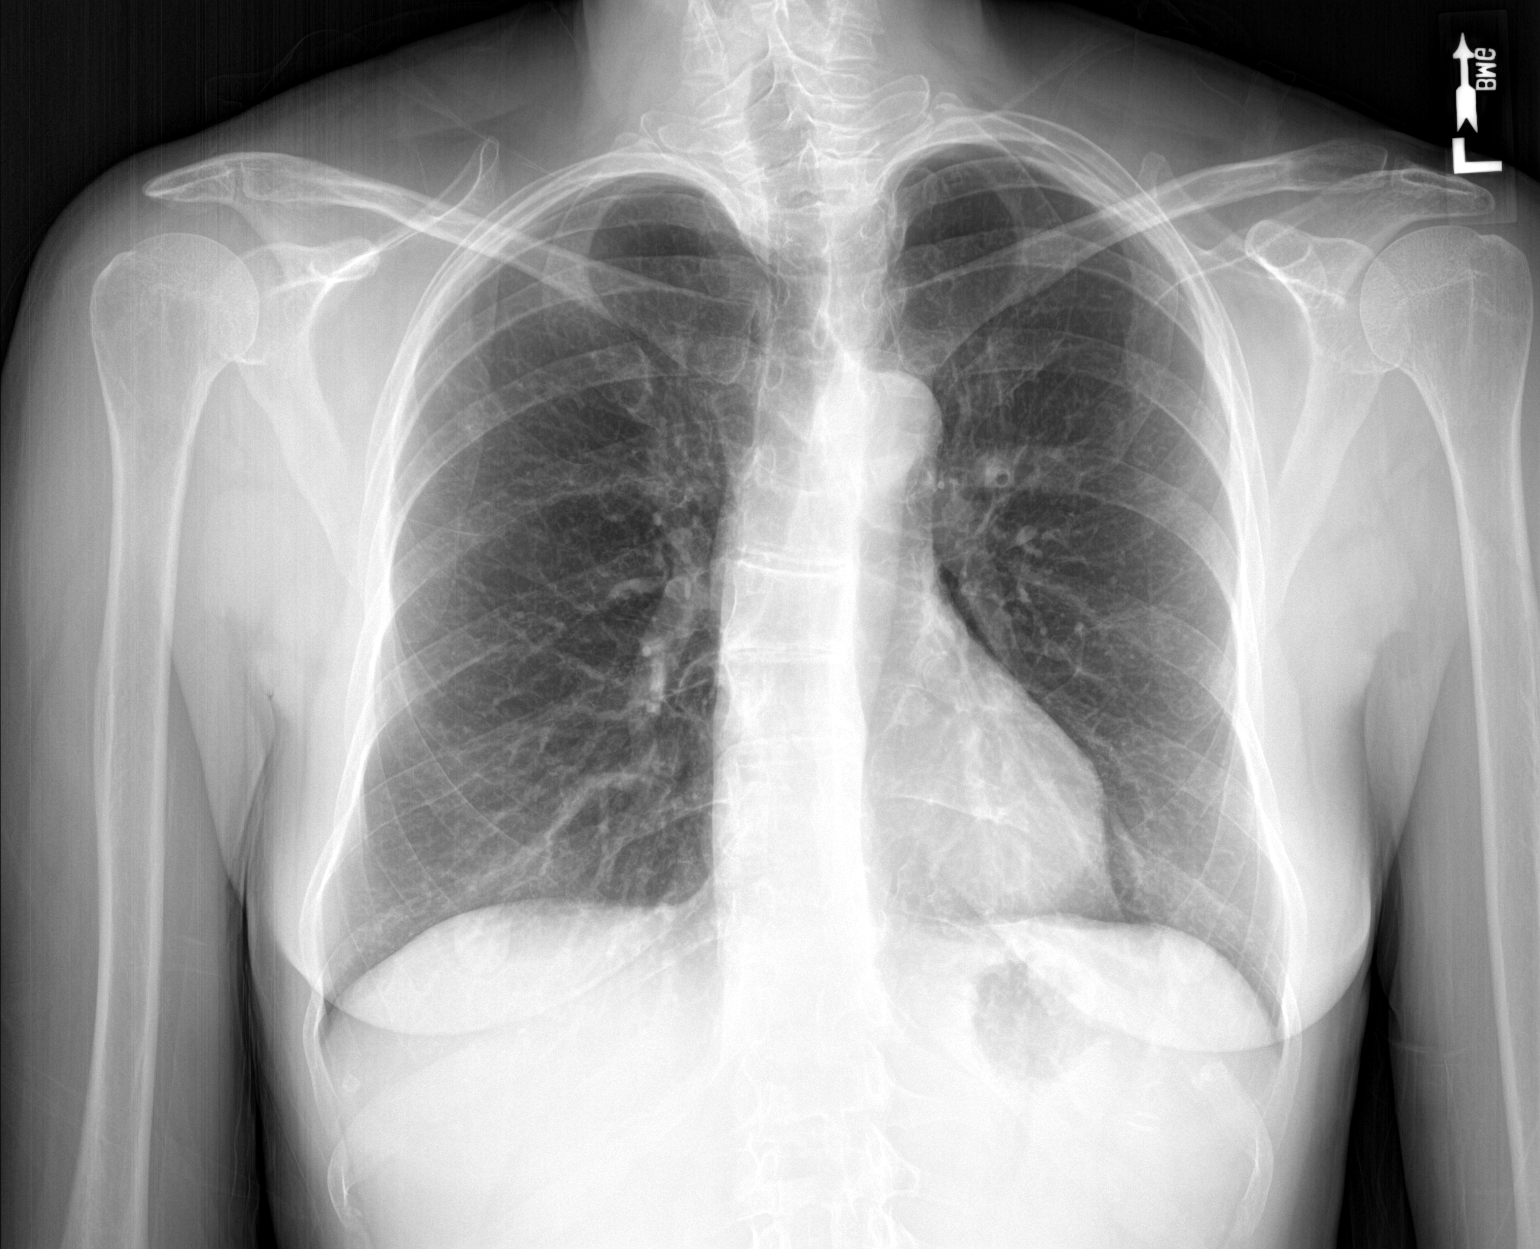

[chest lat]
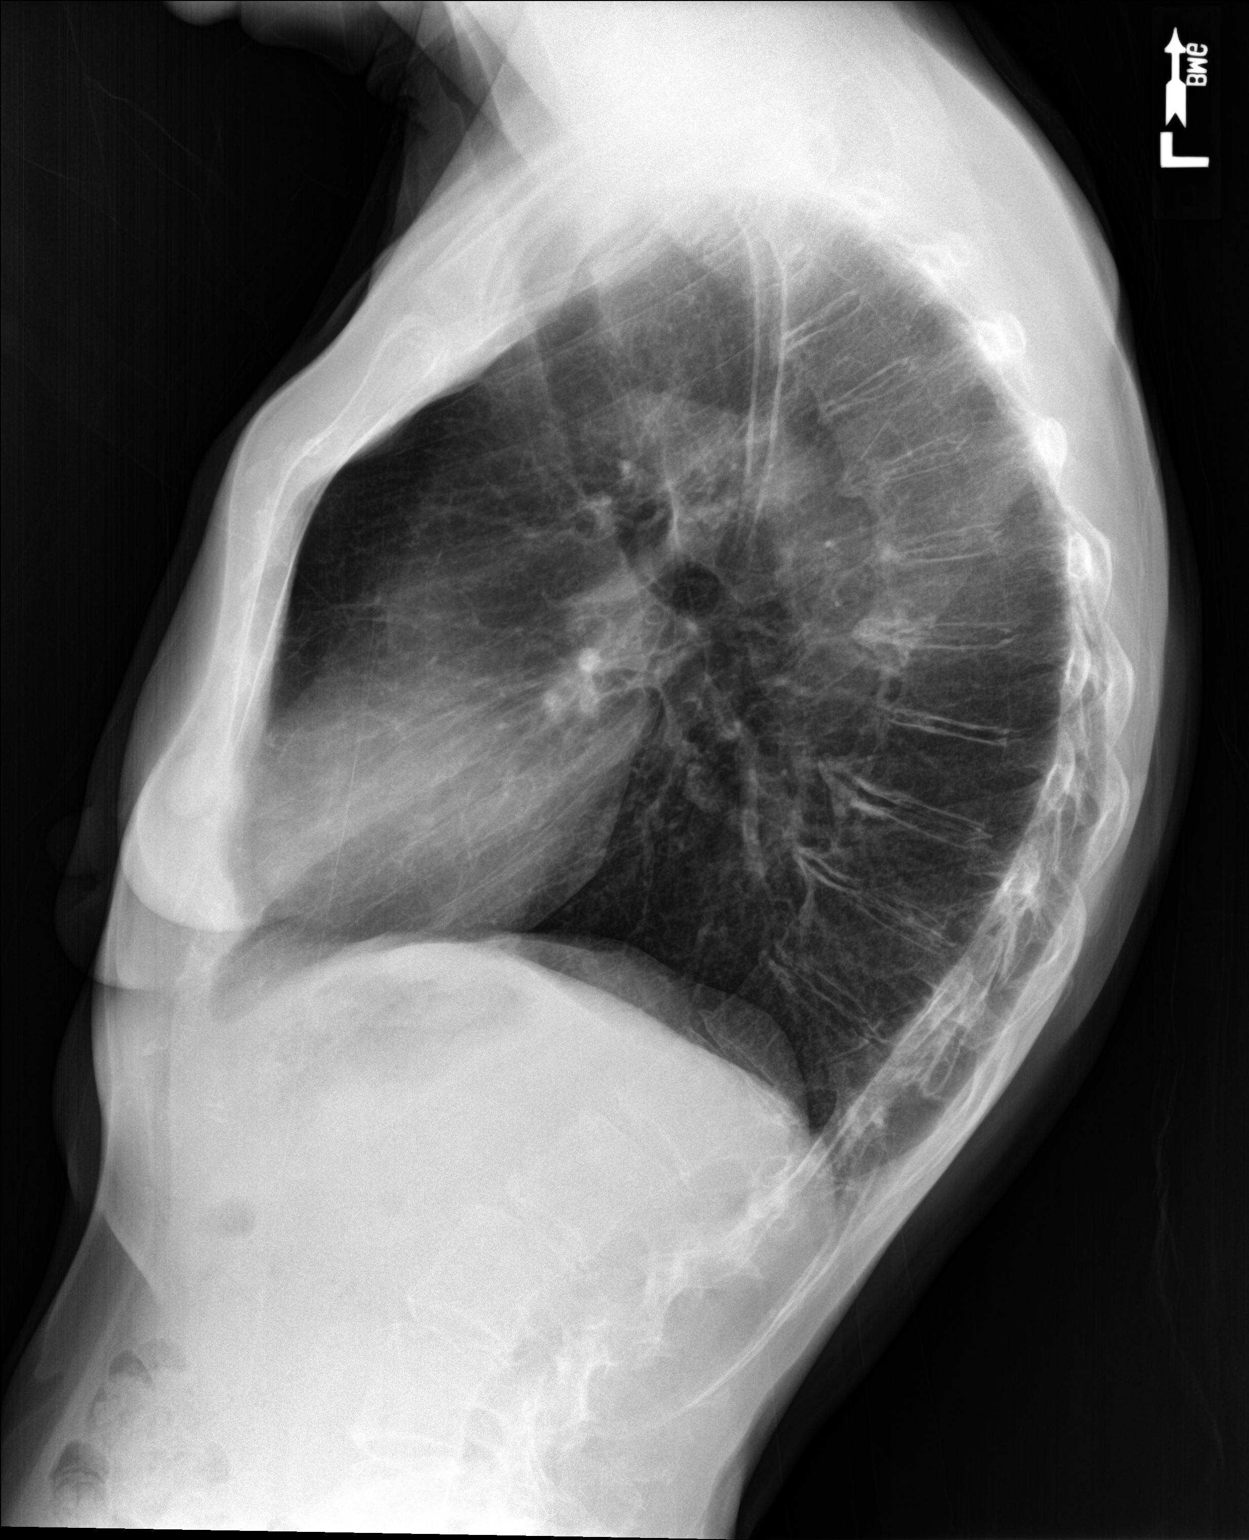

[2 of 2 positions shown; findings below may reference images not displayed]

FINDINGS: Stable lung volumes, hyperinflation with chronic increased AP
dimension to the chest. Mediastinal contours remain normal.
Visualized tracheal air column is within normal limits. No
pneumothorax, pulmonary edema, pleural effusion or confluent
pulmonary opacity. Osteopenia and chronic exaggerated thoracic
kyphosis. No acute osseous abnormality identified. Negative visible
bowel gas pattern.
IMPRESSION: Chronic pulmonary hyperinflation. No acute cardiopulmonary
abnormality.

## 2019-10-04 ENCOUNTER — Encounter: Payer: Self-pay | Admitting: Gastroenterology

## 2019-12-05 ENCOUNTER — Encounter: Payer: Self-pay | Admitting: Gastroenterology

## 2019-12-05 ENCOUNTER — Ambulatory Visit (INDEPENDENT_AMBULATORY_CARE_PROVIDER_SITE_OTHER): Payer: 59 | Admitting: Gastroenterology

## 2019-12-05 VITALS — BP 100/70 | HR 94 | Ht 62.0 in | Wt 107.8 lb

## 2019-12-05 DIAGNOSIS — Z1211 Encounter for screening for malignant neoplasm of colon: Secondary | ICD-10-CM

## 2019-12-05 DIAGNOSIS — Z1212 Encounter for screening for malignant neoplasm of rectum: Secondary | ICD-10-CM

## 2019-12-05 DIAGNOSIS — K219 Gastro-esophageal reflux disease without esophagitis: Secondary | ICD-10-CM

## 2019-12-05 DIAGNOSIS — K5900 Constipation, unspecified: Secondary | ICD-10-CM | POA: Diagnosis not present

## 2019-12-05 MED ORDER — NA SULFATE-K SULFATE-MG SULF 17.5-3.13-1.6 GM/177ML PO SOLN: 1.0000 | Freq: Once | ORAL | 0 refills | Status: AC

## 2019-12-05 MED ORDER — LANSOPRAZOLE 30 MG PO CPDR: 30.0000 mg | DELAYED_RELEASE_CAPSULE | Freq: Every day | ORAL | 3 refills | Status: DC

## 2019-12-05 NOTE — Patient Instructions (Signed)
We have sent the following medications to your pharmacy for you to pick up at your convenience: Prevacid 30 mg daily and Suprep.  You have been scheduled for an endoscopy and colonoscopy. Please follow the written instructions given to you at your visit today. Please pick up your prep supplies at the pharmacy within the next 1-3 days. If you use inhalers (even only as needed), please bring them with you on the day of your procedure.   Thank you for choosing me and [facility].  Pricilla Riffle. Dagoberto Ligas., MD., [facility]

## 2019-12-05 NOTE — Progress Notes (Signed)
History of Present Illness: This is a 54 year old female self referred for the evaluation of a chronic cough, constipation and CRC screening.  She has a long history of chronic cough and reflux symptoms.  Her cough is felt secondary to LPR.  She relates she had a flare of reflux symptoms in July and doubled up her typical dose of lansoprazole to 30 mg daily to gain control of symptoms.  She states her symptoms improved however she still has frequent breakthrough symptoms on her standard dose of lansoprazole 15 mg daily.  She describes occasional heartburn, occasional sore throat and occasional cough.  Over the past several years she has had intermittent difficulties with constipation which have become more difficult to control.  She has tried smooth move tea, high-fiber smoothies, Colace and senna products without consistent results.  No prior colonoscopy. Denies weight loss, abdominal pain, diarrhea, change in stool caliber, melena, hematochezia, nausea, vomiting, dysphagia, chest pain.    Allergies  Allergen Reactions  . Sulfonamide Derivatives Hives  . Penicillins Rash   Outpatient Medications Prior to Visit  Medication Sig Dispense Refill  . cholecalciferol (VITAMIN D) 400 units TABS tablet Take 400 Units by mouth.    . Cranberry (AZO-CRANBERRY) 450 MG TABS Take by mouth.    . docusate sodium (COLACE) 100 MG capsule Take 100 mg by mouth 2 (two) times daily.    . lansoprazole (PREVACID) 15 MG capsule Take 15 mg by mouth daily.    Marland Kitchen pyridOXINE (VITAMIN B-6) 100 MG tablet Take 100 mg by mouth daily.    . Senna 30 MG MISC Take by mouth daily.    . Tragacanth (ASTRAGALUS ROOT) POWD by Does not apply route.    Marland Kitchen albuterol (PROVENTIL HFA;VENTOLIN HFA) 108 (90 Base) MCG/ACT inhaler Inhale 2 puffs into the lungs every 6 (six) hours as needed. 1 Inhaler 1  . azithromycin (ZITHROMAX) 250 MG tablet Take 2 tabs PO x 1 dose, then 1 tab PO QD x 4 days 6 tablet 0  . chlorpheniramine-HYDROcodone  (TUSSIONEX PENNKINETIC ER) 10-8 MG/5ML SUER Take 5 mLs by mouth every 12 (twelve) hours as needed for cough. 100 mL 0  . doxycycline (VIBRAMYCIN) 100 MG capsule Take 1 capsule (100 mg total) by mouth 2 (two) times daily. (Patient not taking: Reported on 03/23/2018) 20 capsule 0  . HYDROcodone-homatropine (HYCODAN) 5-1.5 MG/5ML syrup Take 5 mLs by mouth every 8 (eight) hours as needed for cough. (Patient not taking: Reported on 03/23/2018) 75 mL 0  . metroNIDAZOLE (METROGEL) 0.75 % gel Apply 1 application topically 2 (two) times daily.    . predniSONE (DELTASONE) 20 MG tablet Take 2 tablets daily with breakfast. (Patient not taking: Reported on 05/05/2017) 10 tablet 0   No facility-administered medications prior to visit.   Past Medical History:  Diagnosis Date  . GERD (gastroesophageal reflux disease)   . Kyphosis    Past Surgical History:  Procedure Laterality Date  . HIP FRACTURE SURGERY     Social History   Socioeconomic History  . Marital status: Married    Spouse name: Not on file  . Number of children: 0  . Years of education: Not on file  . Highest education level: Not on file  Occupational History  . Not on file  Tobacco Use  . Smoking status: Never Smoker  . Smokeless tobacco: Never Used  Vaping Use  . Vaping Use: Never used  Substance and Sexual Activity  . Alcohol use: Yes  Alcohol/week: 4.0 standard drinks    Types: 2 Cans of beer, 1 Glasses of wine, 1 Shots of liquor per week  . Drug use: Never  . Sexual activity: Yes  Other Topics Concern  . Not on file  Social History Narrative  . Not on file   Social Determinants of Health   Financial Resource Strain:   . Difficulty of Paying Living Expenses: Not on file  Food Insecurity:   . Worried About Charity fundraiser in the Last Year: Not on file  . Ran Out of Food in the Last Year: Not on file  Transportation Needs:   . Lack of Transportation (Medical): Not on file  . Lack of Transportation (Non-Medical): Not  on file  Physical Activity:   . Days of Exercise per Week: Not on file  . Minutes of Exercise per Session: Not on file  Stress:   . Feeling of Stress : Not on file  Social Connections:   . Frequency of Communication with Friends and Family: Not on file  . Frequency of Social Gatherings with Friends and Family: Not on file  . Attends Religious Services: Not on file  . Active Member of Clubs or Organizations: Not on file  . Attends Archivist Meetings: Not on file  . Marital Status: Not on file   Family History  Problem Relation Age of Onset  . Hypertension Mother   . Breast cancer Maternal Aunt      Review of Systems: Pertinent positive and negative review of systems were noted in the above HPI section. All other review of systems were otherwise negative.   Physical Exam: General: Well developed, well nourished, no acute distress Head: Normocephalic and atraumatic Eyes:  sclerae anicteric, EOMI Ears: Normal auditory acuity Mouth: Not examined, mask on during Covid-19 pandemic Neck: Supple, no masses or thyromegaly Lungs: Clear throughout to auscultation Heart: Regular rate and rhythm; no murmurs, rubs or bruits Abdomen: Soft, non tender and non distended. No masses, hepatosplenomegaly or hernias noted. Normal Bowel sounds Rectal: Deferred to colonoscopy Musculoskeletal: Symmetrical with no gross deformities  Skin: No lesions on visible extremities Pulses:  Normal pulses noted Extremities: No clubbing, cyanosis, edema or deformities noted Neurological: Alert oriented x 4, grossly nonfocal Cervical Nodes:  No significant cervical adenopathy Inguinal Nodes: No significant inguinal adenopathy Psychological:  Alert and cooperative. Normal mood and affect   Assessment and Recommendations:  1. GERD with LPR.  Increase lansoprazole to 30 mg daily long-term to achieve better symptomatic control.  Closely follow antireflux measures.  Rule out esophagitis, Barrett's  esophagus.  Schedule EGD. The risks (including bleeding, perforation, infection, missed lesions, medication reactions and possible hospitalization or surgery if complications occur), benefits, and alternatives to endoscopy with possible biopsy and possible dilation were discussed with the patient and they consent to proceed.    2. CRC screening, average risk.  Schedule colonoscopy. The risks (including bleeding, perforation, infection, missed lesions, medication reactions and possible hospitalization or surgery if complications occur), benefits, and alternatives to colonoscopy with possible biopsy and possible polypectomy were discussed with the patient and they consent to proceed.   3. Constipation.  Recommended adequate daily fiber and water intake.  Regular, daily use of smooth move tea or MiraLAX.  She can titrate the MiraLAX dose between one half scoop daily and 2 scoops daily for adequate results.

## 2020-02-06 ENCOUNTER — Encounter: Payer: Self-pay | Admitting: Gastroenterology

## 2020-02-06 ENCOUNTER — Ambulatory Visit (AMBULATORY_SURGERY_CENTER): Payer: 59 | Admitting: Gastroenterology

## 2020-02-06 ENCOUNTER — Other Ambulatory Visit: Payer: Self-pay

## 2020-02-06 DIAGNOSIS — K449 Diaphragmatic hernia without obstruction or gangrene: Secondary | ICD-10-CM | POA: Diagnosis not present

## 2020-02-06 DIAGNOSIS — K639 Disease of intestine, unspecified: Secondary | ICD-10-CM

## 2020-02-06 DIAGNOSIS — D123 Benign neoplasm of transverse colon: Secondary | ICD-10-CM | POA: Diagnosis not present

## 2020-02-06 DIAGNOSIS — K219 Gastro-esophageal reflux disease without esophagitis: Secondary | ICD-10-CM | POA: Diagnosis not present

## 2020-02-06 DIAGNOSIS — K319 Disease of stomach and duodenum, unspecified: Secondary | ICD-10-CM | POA: Diagnosis not present

## 2020-02-06 DIAGNOSIS — Z1211 Encounter for screening for malignant neoplasm of colon: Secondary | ICD-10-CM | POA: Diagnosis not present

## 2020-02-06 DIAGNOSIS — Z1212 Encounter for screening for malignant neoplasm of rectum: Secondary | ICD-10-CM

## 2020-02-06 DIAGNOSIS — K2970 Gastritis, unspecified, without bleeding: Secondary | ICD-10-CM | POA: Diagnosis not present

## 2020-02-06 DIAGNOSIS — K6389 Other specified diseases of intestine: Secondary | ICD-10-CM | POA: Diagnosis not present

## 2020-02-06 DIAGNOSIS — K317 Polyp of stomach and duodenum: Secondary | ICD-10-CM | POA: Diagnosis not present

## 2020-02-06 DIAGNOSIS — K5900 Constipation, unspecified: Secondary | ICD-10-CM

## 2020-02-06 MED ORDER — SODIUM CHLORIDE 0.9 % IV SOLN: 500.0000 mL | Freq: Once | INTRAVENOUS | Status: DC

## 2020-02-06 NOTE — Progress Notes (Signed)
pt tolerated well. VSS. awake and to recovery. Report given to RN.  

## 2020-02-06 NOTE — Patient Instructions (Signed)
Read your handouts about GERD and hiatal hernia.  Read the remaining handouts as well.  YOU HAD AN ENDOSCOPIC PROCEDURE TODAY AT [facility]:   Refer to the procedure report that was given to you for any specific questions about what was found during the examination.  If the procedure report does not answer your questions, please call your gastroenterologist to clarify.  If you requested that your care partner not be given the details of your procedure findings, then the procedure report has been included in a sealed envelope for you to review at your convenience later.  YOU SHOULD EXPECT: Some feelings of bloating in the abdomen. Passage of more gas than usual.  Walking can help get rid of the air that was put into your GI tract during the procedure and reduce the bloating. If you had a lower endoscopy (such as a colonoscopy or flexible sigmoidoscopy) you may notice spotting of blood in your stool or on the toilet paper. If you underwent a bowel prep for your procedure, you may not have a normal bowel movement for a few days.  Please Note:  You might notice some irritation and congestion in your nose or some drainage.  This is from the oxygen used during your procedure.  There is no need for concern and it should clear up in a day or so.  SYMPTOMS TO REPORT IMMEDIATELY:   Following lower endoscopy (colonoscopy or flexible sigmoidoscopy):  Excessive amounts of blood in the stool  Significant tenderness or worsening of abdominal pains  Swelling of the abdomen that is new, acute  Fever of 100F or higher   Following upper endoscopy (EGD)  Vomiting of blood or coffee ground material  New chest pain or pain under the shoulder blades  Painful or persistently difficult swallowing  New shortness of breath  Fever of 100F or higher  Black, tarry-looking stools  For urgent or emergent issues, a gastroenterologist can be reached at any hour by calling [phone]. Do not use  MyChart messaging for urgent concerns.    DIET:  We do recommend a small meal at first, but then you may proceed to your regular diet.  Drink plenty of fluids but you should avoid alcoholic beverages for 24 hours. A high fiber diet is suggested. It will help you not to have to use laxatives.  Read all of the handouts given to you by your recovery room nurse.  ACTIVITY:  You should plan to take it easy for the rest of today and you should NOT DRIVE or use heavy machinery until tomorrow (because of the sedation medicines used during the test).    FOLLOW UP: Our staff will call the number listed on your records 48-72 hours following your procedure to check on you and address any questions or concerns that you may have regarding the information given to you following your procedure. If we do not reach you, we will leave a message.  We will attempt to reach you two times.  During this call, we will ask if you have developed any symptoms of COVID 19. If you develop any symptoms (ie: fever, flu-like symptoms, shortness of breath, cough etc.) before then, please call [phone].  If you test positive for Covid 19 in the 2 weeks post procedure, please call and report this information to [place].    If any biopsies were taken you will be contacted by phone or by letter within the next 1-3 weeks.  Please call us at [phone]  [phone] if you have not heard about the biopsies in 3 weeks.    SIGNATURES/CONFIDENTIALITY: You and/or your care partner have signed paperwork which will be entered into your electronic medical record.  These signatures attest to the fact that that the information above on your After Visit Summary has been reviewed and is understood.  Full responsibility of the confidentiality of this discharge information lies with you and/or your care-partner.

## 2020-02-06 NOTE — Op Note (Signed)
[facility] Patient Name: Morgan Merritt Procedure Date: 02/06/2020 3:17 PM MRN: [id number] Endoscopist: Meryl Dare , MD Age: 54 Referring MD:  Date of Birth: February 29, 1965 Gender: Female Account #: [id number] Procedure:                Upper GI endoscopy Indications:              Gastroesophageal reflux disease Medicines:                Monitored Anesthesia Care Procedure:                Pre-Anesthesia Assessment:                           - Prior to the procedure, a History and Physical                            was performed, and patient medications and                            allergies were reviewed. The patient's tolerance of                            previous anesthesia was also reviewed. The risks                            and benefits of the procedure and the sedation                            options and risks were discussed with the patient.                            All questions were answered, and informed consent                            was obtained. Prior Anticoagulants: The patient has                            taken no previous anticoagulant or antiplatelet                            agents. ASA Grade Assessment: II - A patient with                            mild systemic disease. After reviewing the risks                            and benefits, the patient was deemed in                            satisfactory condition to undergo the procedure.                           After obtaining informed consent, the endoscope was  passed under direct vision. Throughout the                            procedure, the patient's blood pressure, pulse, and                            oxygen saturations were monitored continuously. The                            Endoscope was introduced through the mouth, and                            advanced to the second part of duodenum. The upper                            GI endoscopy was  accomplished without difficulty.                            The patient tolerated the procedure well. Scope In: Scope Out: Findings:                 The examined esophagus was normal.                           Patchy mildly erythematous mucosa without bleeding                            was found in the gastric body. Biopsies were taken                            with a cold forceps for histology.                           A small hiatal hernia was present.                           Multiple 3 to 7 mm sessile polyps with no bleeding                            and no stigmata of recent bleeding were found in                            the gastric fundus and in the gastric body.                            Biopsies were taken with a cold forceps for                            histology.                           The exam of the stomach was otherwise normal.                           The  duodenal bulb and second portion of the                            duodenum were normal. Complications:            No immediate complications. Estimated Blood Loss:     Estimated blood loss was minimal. Impression:               - Normal esophagus.                           - Erythematous mucosa in the gastric body. Biopsied.                           - Small hiatal hernia.                           - Multiple gastric polyps. Biopsied.                           - Normal duodenal bulb and second portion of the                            duodenum. Recommendation:           - Patient has a contact number available for                            emergencies. The signs and symptoms of potential                            delayed complications were discussed with the                            patient. Return to normal activities tomorrow.                            Written discharge instructions were provided to the                            patient.                           - Resume previous diet.                            - Follow antireflux measures.                           - Continue present medications.                           - Await pathology results. Ladene Artist, MD 02/06/2020 4:09:31 PM This report has been signed electronically.

## 2020-02-06 NOTE — Progress Notes (Signed)
VS-CW 

## 2020-02-06 NOTE — Progress Notes (Signed)
Called to room to assist during endoscopic procedure.  Patient ID and intended procedure confirmed with present staff. Received instructions for my participation in the procedure from the performing physician.  

## 2020-02-06 NOTE — Op Note (Addendum)
[facility] Patient Name: Morgan Merritt Procedure Date: 02/06/2020 3:17 PM MRN: [id number] Endoscopist: Ladene Artist , MD Age: 54 Referring MD:  Date of Birth: 25-Dec-1965 Gender: Female Account #: [id number] Procedure:                Colonoscopy Indications:              Screening for colorectal malignant neoplasm Medicines:                Monitored Anesthesia Care Procedure:                Pre-Anesthesia Assessment:                           - Prior to the procedure, a History and Physical                            was performed, and patient medications and                            allergies were reviewed. The patient's tolerance of                            previous anesthesia was also reviewed. The risks                            and benefits of the procedure and the sedation                            options and risks were discussed with the patient.                            All questions were answered, and informed consent                            was obtained. Prior Anticoagulants: The patient has                            taken no previous anticoagulant or antiplatelet                            agents. ASA Grade Assessment: II - A patient with                            mild systemic disease. After reviewing the risks                            and benefits, the patient was deemed in                            satisfactory condition to undergo the procedure.                           After obtaining informed consent, the colonoscope  was passed under direct vision. Throughout the                            procedure, the patient's blood pressure, pulse, and                            oxygen saturations were monitored continuously. The                            Olympus PFC-[id number]) Colonoscope was                            introduced through the anus and advanced to the the                            cecum, identified by  appendiceal orifice and                            ileocecal valve. The ileocecal valve, appendiceal                            orifice, and rectum were photographed. The quality                            of the bowel preparation was good. The colonoscopy                            was somewhat difficult due to restricted mobility                            of the colon and a tortuous colon. The patient                            tolerated the procedure well. Scope In: 3:25:51 PM Scope Out: 3:50:23 PM Scope Withdrawal Time: 0 hours 16 minutes 9 seconds  Total Procedure Duration: 0 hours 24 minutes 32 seconds  Findings:                 The perianal and digital rectal examinations were                            normal.                           A 6 mm polyp was found in the transverse colon. The                            polyp was sessile. The polyp was removed with a                            cold snare. Resection and retrieval were complete.                           A few small-mouthed diverticula were found in the  ascending colon. There was no evidence of                            diverticular bleeding.                           A few small-mouthed diverticula were found in the                            left colon. There was evidence of diverticular                            spasm. Peri-diverticular erythema was seen. Mild                            melanosis suspected. There was no evidence of                            diverticular bleeding. Biopsies were taken with a                            cold forceps for histology.                           The exam was otherwise without abnormality on                            direct and retroflexion views. Complications:            No immediate complications. Estimated blood loss:                            None. Estimated Blood Loss:     Estimated blood loss: none. Impression:               - One 6 mm  polyp in the transverse colon, removed                            with a cold snare. Resected and retrieved.                           - Mild diverticulosis in the ascending colon.                           - Mild diverticulosis in the left colon.                            Peri-diverticular erythema was seen. Biopsied.                           - Mild left colon melanosis suspected.                           - The examination was otherwise normal on direct  and retroflexion views. Recommendation:           - Repeat colonoscopy after studies are complete for                            surveillance based on pathology results.                           - Patient has a contact number available for                            emergencies. The signs and symptoms of potential                            delayed complications were discussed with the                            patient. Return to normal activities tomorrow.                            Written discharge instructions were provided to the                            patient.                           - High fiber diet.                           - Continue present medications.                           - Await pathology results. Ladene Artist, MD 02/06/2020 4:05:58 PM This report has been signed electronically.

## 2020-02-08 ENCOUNTER — Telehealth: Payer: Self-pay

## 2020-02-08 NOTE — Telephone Encounter (Signed)
NO ANSWER, MESSAGE LEFT FOR PATIENT. 

## 2020-02-19 ENCOUNTER — Encounter: Payer: Self-pay | Admitting: Gastroenterology

## 2020-09-14 ENCOUNTER — Other Ambulatory Visit: Payer: Self-pay | Admitting: Gastroenterology

## 2021-06-03 LAB — LAB REPORT - SCANNED
Free T4: 1.12 ng/dL
TSH: 0.95 (ref 0.41–5.90)

## 2021-06-06 ENCOUNTER — Inpatient Hospital Stay: Payer: 59

## 2021-06-06 ENCOUNTER — Encounter: Payer: Self-pay | Admitting: Cardiology

## 2021-06-06 ENCOUNTER — Ambulatory Visit: Payer: 59 | Admitting: Cardiology

## 2021-06-06 VITALS — BP 143/90 | HR 96 | Temp 98.0°F | Resp 16 | Ht 62.0 in | Wt 108.6 lb

## 2021-06-06 DIAGNOSIS — R002 Palpitations: Secondary | ICD-10-CM

## 2021-06-06 DIAGNOSIS — I10 Essential (primary) hypertension: Secondary | ICD-10-CM

## 2021-06-06 NOTE — Progress Notes (Signed)
? ? ?Patient referred by Sueanne Margarita, DO for palpitations ? ?Subjective:  ? ?Morgan Merritt, female    DOB: 04/30/1965, 56 y.o.   MRN: [id number] ? ? ?Chief Complaint  ?Patient presents with  ? Palpitations  ? New Patient (Initial Visit)  ?  Referred by Sueanne Margarita, DO. ?  ? ? ? ?HPI ? ?56 y.o. Caucasian female with palpitations, elevated blood pressure ? ?Patient is a Software engineer, works at [employer]. She has had a lot of stress lately, related family/friend illness and work. She has noticed episodes of palpitations lasting for up an hour, without any chest pain or shortness of breath symptoms.  Blood pressure has been elevated recently.  She drinks 1 cup of coffee every day, but is trying to cut down.  She drinks about 2 alcoholic drinks couple times a week.  She exercises some, but not regularly.  She does not eat overly salty diet.  She underwent recent labs, but reportedly normal, including thyroid function. ? ? ?Past Medical History:  ?Diagnosis Date  ? Allergy   ? GERD (gastroesophageal reflux disease)   ? Kyphosis   ? ? ? ?Past Surgical History:  ?Procedure Laterality Date  ? COLONOSCOPY    ? HIP FRACTURE SURGERY    ? UPPER GASTROINTESTINAL ENDOSCOPY    ? ? ? ?Social History  ? ?Tobacco Use  ?Smoking Status Never  ?Smokeless Tobacco Never  ? ? ?Social History  ? ?Substance and Sexual Activity  ?Alcohol Use Yes  ? Alcohol/week: 4.0 standard drinks  ? Types: 2 Cans of beer, 1 Glasses of wine, 1 Shots of liquor per week  ? ? ? ?Family History  ?Problem Relation Age of Onset  ? Hypertension Mother   ? Breast cancer Maternal Aunt   ? Colon cancer Cousin   ? Esophageal cancer Neg Hx   ? Rectal cancer Neg Hx   ? Stomach cancer Neg Hx   ? ? ? ? ?Current Outpatient Medications:  ?  cholecalciferol (VITAMIN D) 400 units TABS tablet, Take 400 Units by mouth., Disp: , Rfl:  ?  Cranberry 450 MG TABS, Take by mouth., Disp: , Rfl:  ?  hydrocortisone 2.5 % ointment, Apply topically 2 (two) times daily., Disp: , Rfl:  ?   omeprazole (PRILOSEC) 40 MG capsule, Take 40 mg by mouth daily., Disp: , Rfl:  ?  polyethylene glycol (MIRALAX / GLYCOLAX) 17 g packet, Take 17 g by mouth daily., Disp: , Rfl:  ?  pyridOXINE (VITAMIN B-6) 100 MG tablet, Take 100 mg by mouth daily., Disp: , Rfl:  ?  Tragacanth (ASTRAGALUS ROOT) POWD, by Does not apply route., Disp: , Rfl:  ?  docusate sodium (COLACE) 100 MG capsule, Take 100 mg by mouth 2 (two) times daily., Disp: , Rfl:  ?  Senna 30 MG MISC, Take by mouth daily., Disp: , Rfl:  ? ? ?Cardiovascular and other pertinent studies: ? ?Reviewed external labs and tests, independently interpreted ? ?EKG 06/06/2021: ?Sinus rhythm 91 bpm ?Low voltage, otherwise normal EKG ? ? ?Recent labs: ?Not available ? ? ? ?Review of Systems  ?Cardiovascular:  Positive for palpitations. Negative for chest pain, dyspnea on exertion, leg swelling and syncope.  ? ?   ? ? ?Vitals:  ? 06/06/21 1020  ?BP: (!) 143/90  ?Resp: 16  ?Temp: 98 ?F (36.7 ?C)  ? ? ? ?Body mass index is 19.86 kg/m?. ?Filed Weights  ? 06/06/21 1020  ?Weight: 108 lb 9.6 oz (49.3 kg)  ? ? ? ?  Objective:  ? Physical Exam ?Vitals and nursing note reviewed.  ?Constitutional:   ?   General: She is not in acute distress. ?Neck:  ?   Vascular: No JVD.  ?Cardiovascular:  ?   Rate and Rhythm: Normal rate and regular rhythm.  ?   Heart sounds: Normal heart sounds. No murmur heard. ?Pulmonary:  ?   Effort: Pulmonary effort is normal.  ?   Breath sounds: Normal breath sounds. No wheezing or rales.  ?Musculoskeletal:  ?   Right lower leg: No edema.  ?   Left lower leg: No edema.  ? ? ? ?   ? ?Visit diagnoses: ?  ICD-10-CM   ?1. Palpitations  R00.2 EKG 12-Lead  ?  LONG TERM MONITOR (3-14 DAYS)  ?  ?2. Essential hypertension  I10 PCV ECHOCARDIOGRAM COMPLETE  ?  ?  ? ?Orders Placed This Encounter  ?Procedures  ? EKG 12-Lead  ?  ?  ? ?Assessment & Recommendations:  ? ?56 y.o. Caucasian female with palpitations, elevated blood pressure ? ?Hypertension, palpitations: ?Likely  contributing risk factors are stress, possibly alcohol and caffeine intake-on the latter 2 not very high to begin with.  Discussed lifestyle modifications, primarily related to the risk factors.  She would like to hold off starting any medication at this time, if possible.  I think this is reasonable.  Recommend echocardiogram and in 1 week cardiac telemetry. ? ?Of note, patient is stated to lose insurance by the end of May, and would like to get the work-up done prior to that.  Patient will be in touch with me regarding her home blood pressure monitoring numbers. ? ?Further recommendations after above testing. ? ? ?Thank you for referring the patient to [place]. Please feel free to contact with any questions. ? ? ?Nigel Mormon, MD ?Pager: [phone] ?Office: [phone] ?

## 2021-06-17 ENCOUNTER — Ambulatory Visit: Payer: 59

## 2021-06-17 ENCOUNTER — Telehealth: Payer: Self-pay | Admitting: Pharmacist

## 2021-06-17 DIAGNOSIS — I10 Essential (primary) hypertension: Secondary | ICD-10-CM

## 2021-06-17 NOTE — Telephone Encounter (Signed)
Thank you :)

## 2021-06-19 ENCOUNTER — Encounter: Payer: Self-pay | Admitting: Cardiology

## 2021-06-19 NOTE — Telephone Encounter (Signed)
From patient.

## 2021-06-23 NOTE — Telephone Encounter (Signed)
From patient.

## 2021-06-25 NOTE — Telephone Encounter (Signed)
From patient.

## 2021-06-26 ENCOUNTER — Other Ambulatory Visit: Payer: Self-pay

## 2021-06-26 MED ORDER — AMLODIPINE BESYLATE 5 MG PO TABS: 5.0000 mg | ORAL_TABLET | Freq: Every day | ORAL | 3 refills | Status: DC

## 2021-06-26 NOTE — Telephone Encounter (Signed)
Noted. Please send amlodipine 5 mg 90 pillsX3 refills. ? ?Thanks ?MJP ?

## 2022-05-19 ENCOUNTER — Other Ambulatory Visit: Payer: Self-pay | Admitting: Cardiology

## 2023-05-09 ENCOUNTER — Other Ambulatory Visit: Payer: Self-pay | Admitting: Cardiology

## 2023-08-16 ENCOUNTER — Encounter: Payer: Self-pay | Admitting: Cardiology

## 2023-08-16 ENCOUNTER — Ambulatory Visit: Payer: Self-pay | Attending: Cardiology | Admitting: Cardiology

## 2023-08-16 VITALS — BP 120/74 | HR 85 | Ht 62.0 in | Wt 111.0 lb

## 2023-08-16 DIAGNOSIS — I10 Essential (primary) hypertension: Secondary | ICD-10-CM | POA: Insufficient documentation

## 2023-08-16 MED ORDER — AMLODIPINE BESYLATE 5 MG PO TABS: 5.0000 mg | ORAL_TABLET | Freq: Every day | ORAL | 3 refills | Status: AC

## 2023-08-16 NOTE — Patient Instructions (Signed)
 Follow-Up: At [facility], you and your health needs are our priority.  As part of our continuing mission to provide you with exceptional heart care, our providers are all part of one team.  This team includes your primary Cardiologist (physician) and Advanced Practice Providers or APPs (Physician Assistants and Nurse Practitioners) who all work together to provide you with the care you need, when you need it.  Your next appointment:   AS NEEDED   Provider:   Cody Das, MD

## 2023-08-16 NOTE — Progress Notes (Signed)
  Cardiology Office Note:  .   Date:  08/16/2023  ID:  SAMARIA ANES, DOB 09-08-1965, MRN [id number] PCP: Valentin Skates, DO  [facility] Providers Cardiologist:  Newman Lawrence, MD PCP: Valentin Skates, DO  Chief Complaint  Patient presents with   Hypertension     Lya FREDERIKA HUKILL is a 58 y.o. female with hypertension, palpitations  History of Present Illness  I last saw the patient in 2023.  She is a Teacher, early years/pre, works part-time at [employer].  Unfortunately, she does not have medical insurance at this time.  She sees her OB/GYN for regular follow-up, but does not see PCP regularly.  She denies any palpitations at this time.  Workup was reassuring in 2023.  Blood pressure is well-controlled on amlodipine .    Vitals:   08/16/23 0913  BP: 120/74  Pulse: 85  SpO2: 98%      Review of Systems  Cardiovascular:  Negative for chest pain, dyspnea on exertion, leg swelling, palpitations and syncope.        Studies Reviewed: SABRA        Mobile cardiac telemetry 6 days 2023: Dominant rhythm: Sinus. HR 46-151 bpm. Avg HR 82 bpm. 1 episodes of SVT, fastest at 100 bpm for 6 beats. <1% isolated SVE, no couplet/triplets. 0 episodes of VT. <1% isolated VE, no couplet/triplets. No atrial fibrillation/atrial flutter/VT/high grade AV block, sinus pause >3sec noted. 0 patient triggered events.   Echocardiogram 2023:  Left ventricle cavity is normal in size and wall thickness. Normal global  wall motion. Normal LV systolic function with EF 59%. Normal diastolic  filling pattern.  Mild (Grade I) mitral regurgitation.  Mild tricuspid regurgitation.  No evidence of pulmonary hypertension.    Physical Exam Vitals and nursing note reviewed.  Constitutional:      General: She is not in acute distress. Neck:     Vascular: No JVD.   Cardiovascular:     Rate and Rhythm: Normal rate and regular rhythm.     Heart sounds: Normal heart sounds. No murmur heard. Pulmonary:      Effort: Pulmonary effort is normal.     Breath sounds: Normal breath sounds. No wheezing or rales.   Musculoskeletal:     Right lower leg: No edema.     Left lower leg: No edema.      VISIT DIAGNOSES:   ICD-10-CM   1. Primary hypertension  I10        Shareese CHRISTELLA Emami is a 58 y.o. female with hypertension, palpitations  Assessment & Plan  Hypertension: Well-controlled on amlodipine   Palpitations: No recent symptoms, reassuring monitor in 2023 as discussed above.  If and when she has medical insurance, recommend checking lipid panel and consider CT cardiac scoring as can for cardiac risk stratification.  In the meantime, will obtain lab results checked by her PCP in 2023 for my records.  No orders of the defined types were placed in this encounter.    F/u as needed  Signed, Newman JINNY Lawrence, MD

## 2023-08-18 ENCOUNTER — Ambulatory Visit: Payer: Self-pay | Admitting: Cardiology

## 2023-08-25 ENCOUNTER — Telehealth: Payer: Self-pay | Admitting: Licensed Clinical Social Worker

## 2023-08-25 NOTE — Telephone Encounter (Signed)
 H&V Care Navigation CSW Progress Note  Clinical Social Worker contacted patient by phone to f/u as noted to be self- pay, employed part time. No answer at [phone], left voicemail offering assistance if interested.  Patient is participating in a Managed Medicaid Plan:  No, self pay only  SDOH Screenings   Tobacco Use: Low Risk  (08/16/2023)    Morgan Merritt, MSW, LCSW Clinical Social Worker II [facility] Heart/Vascular Care Navigation  [phone]- work cell phone (preferred)

## 2023-08-30 NOTE — Telephone Encounter (Addendum)
 H&V Care Navigation CSW Progress Note  Clinical Social Worker contacted patient by phone to f/u on voicemail left by pt. Reached her 7/10 at [phone]. Introduced self, role, reason for call. Pt shares she if self pay, she is not interested in any assistance at this time, does not qualify for Medicaid. Encouraged her to call us  in future if any questions and discussed CAFA if needed.  Patient is participating in a Managed Medicaid Plan:  No, self pay only  SDOH Screenings   Tobacco Use: Low Risk  (08/16/2023)     Marit Lark, MSW, LCSW Clinical Social Worker II [facility] Heart/Vascular Care Navigation  [phone]- work cell phone (preferred)
